# Patient Record
Sex: Male | Born: 1980 | Race: White | Hispanic: No | Marital: Single | Smoking: Current every day smoker
Health system: Southern US, Community
[De-identification: ages and names within clinical notes are randomized; demographics above are authoritative.]

## PROBLEM LIST (undated history)

## (undated) DIAGNOSIS — I4891 Unspecified atrial fibrillation: Secondary | ICD-10-CM

## (undated) DIAGNOSIS — I10 Essential (primary) hypertension: Secondary | ICD-10-CM

## (undated) DIAGNOSIS — E7800 Pure hypercholesterolemia, unspecified: Secondary | ICD-10-CM

## (undated) HISTORY — PX: DENTAL SURGERY: SHX609

---
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
## 2001-01-09 ENCOUNTER — Emergency Department (HOSPITAL_COMMUNITY): Admission: EM | Admit: 2001-01-09 | Discharge: 2001-01-09 | Payer: Self-pay | Admitting: Emergency Medicine

## 2001-02-17 ENCOUNTER — Inpatient Hospital Stay (HOSPITAL_COMMUNITY): Admission: EM | Admit: 2001-02-17 | Discharge: 2001-02-20 | Payer: Self-pay | Admitting: *Deleted

## 2001-02-20 ENCOUNTER — Encounter: Payer: Self-pay | Admitting: Emergency Medicine

## 2001-02-28 ENCOUNTER — Emergency Department (HOSPITAL_COMMUNITY): Admission: EM | Admit: 2001-02-28 | Discharge: 2001-02-28 | Payer: Self-pay | Admitting: Emergency Medicine

## 2001-03-06 ENCOUNTER — Inpatient Hospital Stay (HOSPITAL_COMMUNITY): Admission: EM | Admit: 2001-03-06 | Discharge: 2001-03-10 | Payer: Self-pay | Admitting: *Deleted

## 2001-05-03 ENCOUNTER — Emergency Department (HOSPITAL_COMMUNITY): Admission: EM | Admit: 2001-05-03 | Discharge: 2001-05-03 | Payer: Self-pay | Admitting: Emergency Medicine

## 2001-05-06 ENCOUNTER — Emergency Department (HOSPITAL_COMMUNITY): Admission: EM | Admit: 2001-05-06 | Discharge: 2001-05-06 | Payer: Self-pay | Admitting: *Deleted

## 2001-08-20 HISTORY — PX: APPENDECTOMY: SHX54

## 2007-01-14 ENCOUNTER — Ambulatory Visit: Payer: Self-pay | Admitting: Oncology

## 2007-01-16 LAB — IVY BLEEDING TIME: Bleeding Time: 3 Minutes (ref 2.0–8.0)

## 2007-01-21 ENCOUNTER — Ambulatory Visit (HOSPITAL_COMMUNITY): Admission: RE | Admit: 2007-01-21 | Discharge: 2007-01-21 | Payer: Self-pay | Admitting: Orthopedic Surgery

## 2007-01-21 ENCOUNTER — Encounter (INDEPENDENT_AMBULATORY_CARE_PROVIDER_SITE_OTHER): Payer: Self-pay | Admitting: Orthopedic Surgery

## 2007-01-21 LAB — VON WILLEBRAND PANEL
Factor-VIII Activity: 81 % (ref 75–150)
Ristocetin-Cofactor: 74 % (ref 50–150)
Von Willebrand Ag: 85 % normal (ref 61–164)

## 2007-01-21 LAB — FACTOR 9 ASSAY: Coagulation Factor IX: 152 % — ABNORMAL HIGH (ref 53–150)

## 2007-01-21 LAB — FACTOR 13, QUAL

## 2011-01-02 NOTE — Op Note (Signed)
[name], Weiss              ACCOUNT NO.:  [id number]   MEDICAL RECORD NO.:  [id number]          PATIENT TYPE:  AMB   LOCATION:  SDS                          FACILITY:  [facility]   PHYSICIAN:  Dionne Ano. Gramig III, M.D.DATE OF BIRTH:  12-May-1981   DATE OF PROCEDURE:  01/21/2007  DATE OF DISCHARGE:  01/21/2007                               OPERATIVE REPORT   PREOPERATIVE DIAGNOSIS:  Right hand fiberglass injury with resultant  tenosynovitis of the flexor digitorum profundus and flexor digitorum  superficialis tendons, imbedded fiberglass, pain with activity.   POSTOPERATIVE DIAGNOSIS:  Right hand fiberglass injury with resultant  tenosynovitis of the flexor digitorum profundus and flexor digitorum  superficialis tendons, imbedded fiberglass, pain with activity.   PROCEDURE:  1. Incision and drainage of right hand skin, subcutaneous tissue,      muscle and tendon.  2. Extensive tenosynovectomy flexor digitorum profundus and flexor      digitorum superficialis tendon, right hand.  3. Foreign body removal, right hand.  4. Neurolysis common digital nerve to the second web space.   SURGEON:  Dionne Ano. Amanda Pea, M.D.   ASSISTANT:  None.   COMPLICATIONS:  None.   SPECIMENS:  One.   ANESTHESIA:  General.   TOURNIQUET TIME:  Less than an hour.   INDICATIONS FOR PROCEDURE:  Mr. Wesley Weiss is 45 old male presents above-  mentioned diagnosis.  I have counseled regard to risks, benefits surgery  including risk of infection, bleeding, anesthesia and damage to normal  structures.  He had a preoperative evaluation by Dr. Cyndie Chime  revealing no evidence of advanced coagulation disorder.  I have  discussed these issues with him extensively.   The patient felt that he had a hemophilia factor VIII abnormality and he  underwent workup which in fact, did not show true hemophilia.  I have  discussed this with him at length.  Given this history, I felt it  imperative he get a full workup to  make sure that he did not have a  noted bleeding disorder.   Despite counseling preoperatively, etc. 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  Nevertheless, after a thorough and thoughtful evaluation by  Dr. Cyndie Chime of hematology we have established that the patient does  not in fact have factor VIII deficiency (hemophilia).   PROCEDURE NOTE:  The patient was seen by myself and  anesthesia, he was  permitted, H&P performed and consent obtained.  He was then taken to  operative suite, underwent a smooth induction of general anesthesia was  given preoperative vancomycin and following this the arm was prepped and  draped usual sterile fashion, Betadine scrub and paint.   Once this done, I performed incision about the hand.  His incision was  centered over the fiberglass entrance wound.  This was a 3 cm incision  along Brunner's lines.  I opened the skin flap nicely.  Once this done,  I encountered one piece of fiberglass.  This was removed atraumatically.  Following this, there was a deeper piece imbedded and I  then performed a  inspection of the area.  The patient had the common digital nerve  evaluated and a neurolysis of the common digital nerve to the second web  space was performed.  The patient tolerated this well and no  complicating features.  This was swept out of harm's way and I  thoroughly explored.  It was somewhat hyperemic but stable.   Following, common digital nerve neurolysis, I then identified the flexor  digitorum profundus and superficialis tendons.  There was abundant  tenosynovitis and this was removed meticulously.  Both the flexor  digitorum profundus and flexor digitorum superficialis tendons underwent  as distinct and separate tenolysis and tenosynovectomy.  There were  small bits of fiberglass encased in this area and this was removed.   Following this, I swept the retractor more deeply  and identified that  fiberglass deeper in the hand and remove this as well.  This was deep to  the flexor tendon sheath and near the MCP joint of course.   Thus tenosynovectomy neurolysis of the common digital nerve and removal  of multiple fiberglass foreign bodies (three to four was accomplished  up.  There two main fragments notable and these were removed entirely.   Following this, I then irrigated copiously with copious amounts of  saline and performed stress radiography.  Stress radiography of the  pieces in a specimen cup showed notable findings and abnormal foreign  body composition under fluoro.  I then x-rayed hand and noted no foreign  bodies indicating that we had removed all the fiberglass.  With this  performed, I then irrigated additionally deflated tourniquet, obtained  hemostasis bipolar electrocautery and closed wound with interrupted  Prolene suture.  He was dressed sterilely.  10 mL of 0.25% Sensorcaine  without epinephrine were placed to provide postop analgesia.  The  patient tolerated this well and there no complicating features.  Following this he was taken to recovery room.  He will be discharged  home on Vicodin for pain, return to see me office in 10 days and work on  finger range of motion.  I have discussed with him do's and don't's,  etc. and all questions encouraged and answered.           ______________________________  Dionne Ano. Everlene Other, M.D.     Nash Mantis  D:  01/21/2007  T:  01/22/2007  Job:  [id number]

## 2011-01-05 NOTE — H&P (Signed)
[facility]  Patient:    Wesley Weiss, Wesley Weiss                   MRN: [id number] Adm. Date:  [id number] Disc. Date: [id number] Attending:  Denny Weiss Dictator:   Wesley Weiss, R.N., F.N.P.                   Psychiatric Admission Assessment  DATE OF ADMISSION:  March 06, 2001  CHIEF COMPLAINT:  "Im angry and depressed.  Ive felt like killing myself all of my life and now its worse."  PATIENT IDENTIFICATION:  This is an 30 year old Caucasian male who is a voluntary admission for depression with thoughts of shooting himself and access to guns.  HISTORY OF PRESENT ILLNESS:  The patient reports a past history of depression, conduct disorder, PTSD, and attention-deficit/hyperactivity disorder.  He came in voluntarily to the [facility] office accompanied by his grandfather secondary to suicidal thoughts with a plan to shoot himself and access to a gun, which he reports he had in his hand and then it was taken away by his friend.  He continues to feel thoughts of suicide.  He denies any homicidal ideation.  He denies any auditory or visual hallucinations.  He states that he has been hopeless, sad, and tearful.  He reports being hostile, irritable, and emotionally withdrawn.  He has generally been uncooperative today throughout the interview.  The patient reports that he has been noncompliant with his medications but will give no particular reason for this.  He has not taken his Zyprexa or his other medications, he states, in over a week.  He cites social stressors of finding out that his girlfriend is pregnant but he is quite unclear about this.  This was revealed to the assessment team assessing him for admission and he states today that he is concerned about the pressure with his girlfriend being pregnant but he cites a mixed social history which is inconsistent with his previous history reported.  PAST PSYCHIATRIC HISTORY:  The patient is followed by Dr.  Elna Weiss at [facility], last seen two days ago.  He has one previous inpatient admission at [facility] and that one on February 17, 2001. He was also hospitalized previously at [facility] at age 35 for conduct disorder, depression, PTSD, and ADHD.  He has a history of previous suicide attempts including prior attempts to hang himself.  SUBSTANCE ABUSE HISTORY:  The patient smokes approximately one pack per day of cigarettes every day.  He reports smoking marijuana two to three times per week and reports drinking MD 20/20 approximately one pint per day and about 64 ounces of [employer] every day.  PAST MEDICAL HISTORY:  The patient is followed by Dr. Earlene Weiss here in [place].  Medical problems include asthma, kyphosis, and a history of gastric ulcers.  REVIEW OF SYSTEMS:  Back pain 7 out of 10 secondary to his kyphosis and the patient reports vomiting bright red blood for about the last five days but denies any complaints of abdominal pain.  He denies any fever, chills, or signs of infection and he denies any history of syncope or seizures.  MEDICATIONS:  The patient reports that he has been noncompliant for at least one week with his medications and got rid of all of them.  He has been prior to that taking Zyprexa 15 mg q.h.s., Wellbutrin 50 mg at h.s., and Ativan 2.5 mg p.o. b.i.d.  The patient takes Motrin for his back problems periodically.  DRUG ALLERGIES:  PENICILLIN.  POSITIVE PHYSICAL FINDINGS:  The PE was done July 2 at [facility] and was essentially negative.  VITAL SIGNS:  On admission here, the patients vital signs are temperature 98.1, pulse 72, respirations 16, blood pressure 123/78 sitting and 116/82 standing.  SOCIAL HISTORY:  The patient currently reports living alone since he was discharged from this hospital approximately two weeks ago although he came in yesterday accompanied by his grandfather.  He is  educated through the 11th grade.  He is not currently working.  He states he is engaged to get married in September 2002.  He states he currently has three children, a 17-year-old named Wesley Weiss, a 22-month-old named Wesley Weiss, and one unborn child, all by his girlfriend, Wesley Weiss.  However, the patients previous history states that he has no children at all.  The patient reports that he currently lives at [facility] in [place].  We will attempt to contact his grandfather to corroborate his social history.  Currently, attempts to contact him have been unsuccessful.  There is an answer at the home but the grandfather is not available.  FAMILY HISTORY:  Positive for a father with a history of substance abuse.  MENTAL STATUS EXAMINATION:  This is a casually dressed, sleepy patient who is sluggish mentally.  He is irritable and hostile and is passively cooperative. He offers only minimal responses to questions.  Speech is relevant.  Mood is angry, sad, hopeless, and depressed.  Thought process is positive for suicidal ideation and the patient will contract for safety.  He denies any homicidal ideation or auditory or visual hallucinations.  Cognitively he is intact.  ADMISSION DIAGNOSES: Axis I:    1. Major depression, recurrent, severe.            2. Attention-deficit/hyperactivity disorder by history.            3. Conduct disorder by history. Axis II:   Borderline personality disorder. Axis III:  1. Rule out gastrointestinal bleeding.            2. Gastric ulcer by history.            3. Asthma by history.            4. Kyphosis. Axis IV:   Moderate problems with the primary support group, specifically            issues with his girlfriend and her current pregnancy, which we will            investigate further. Axis V:    Current 25, past year 74.  INITIAL PLAN OF CARE:  Plan is to admit the patient to stabilize his mood. Q.51m. checks are in place.  We will discontinue his  Librium detoxification protocol since he appears to be oversedated and we will offer him Librium 25 mg on a p.r.n. basis only.  We will resume his Zyprexa 15 mg at h.s. and  increase his Wellbutrin to 100 mg SR p.o. q.d.  We will need to rule out a GI bleed on him and so therefore we will check his CBC.  We will guaiac his stools x 2 and check his vital signs for postural changes manually on a b.i.d. basis.  We will ask the case manager to corroborate his history with his grandparents and investigate his social stressors a bit more.  ESTIMATED LENGTH OF STAY:  Three to five days. DD:  03/10/01 TD:  03/12/01 Job: [id number]

## 2011-01-05 NOTE — Discharge Summary (Signed)
[facility]  Patient:    Wesley Weiss Visit Number: [id number] MRN: [id number]          Service Type: PSY Location: 50 [phone] Attending Physician:  Denny Peon Adm. Date:  03/06/2001 Disc. Date: 03/10/2001                             Discharge Summary  INTRODUCTION:  Wesley Weiss is an 30 year old single white male who was admitted because of depression and suicidal ideation.  He has a long history of depression with increased suicidal thoughts with a plan to kill himself with a gun.  The patient told that his depression had increased after his father overdosed and died a few days after fathers day.  The patients sleeping has been decreased.  He has difficulty falling asleep.  He has loss of about 15 pounds over the past two months, also withdrawn.  He also had thoughts about hanging himself.  In the past, he had several suicide attempts trying to hang himself, cut himself, hitting his head.  The patient comes from a very troubled environment, mostly he was staying with grandparents. Previously, he was in foster homes.  Prior to admission, the patient was on Wellbutrin 100 mg daily, Zyprexa 15 mg at bedtime and Prilosec.  MEDICAL PROBLEMS:  These include asthma, kyphosis and gastric ulcers.  PHYSICAL EXAMINATION UPON ADMISSION:  This was normal.  HOSPITAL COURSE:  After admitting to the ward, the patient was placed on special observation.  He was able to contract for safety while on the unit. His medication was increased, Zyprexa was increased to 20 mg at bedtime, Wellbutrin sustained release 100 mg to twice a day, and Ativan was added on a p.r.n. basis for anxiety.  On February 18, 2001, the patient was still depressed and tearful but able to promise safety.  He was noncompliant with medication because he felt too drugged on current dose of medication.  He denied hallucinations.  Subsequently, the dose of Zyprexa was decreased to 10 mg  at bedtime.  On February 19, 2001, still depressed and down but affect was a bit bright.  He still admitted to having vision and occasional voices, but slept better and does not feel as much drugged up as before.  He denied suicidal or homicidal thoughts.  Apparently Wellbutrin was not the medication the patient tolerated the best and since treatment with Wellbutrin was not effective in the past and the patient could not tolerate a higher dose, I started him on Effexor initially 37.5 mg daily, later Effexor XR 37.5 mg twice a day.  On February 19, 2001, the patient complained of increased back pain.  He has kyphosis and wears a back brace.  He was sent to the emergency room but no special treatment was provided over there.  On February 20, 2001, the patient was feeling much better.  No more hallucinations for 24 hours, no dangerous ideations.  He tolerated the medication well and was able to promise safety.  Emergency room reported no findings related to the patients back.  There was contact with the patients grandmother who felt that the patient would be ready for discharge and could stay with her.  MEDICAL PROBLEMS:  Vital signs were stable during the hospitalization with blood pressure 130/80, normal temperature, respiratory rate and pulse.  The patient is 6 feet tall and weighed 196 pounds.  REVIEW OF ADDITIONAL LAB WORK:  Normal CBC  with exception of slight elevation of white blood cell count at 11.6, otherwise normal.  Normal chemistry-17. Urine drug screen was negative for substance of abuse.  DISCHARGE DIAGNOSES: Axis I.   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 Axis II.  Borderline personality disorder. Axis III. 1. Asthma.           2. Gastric ulcer.           3. Kyphosis. Axis IV.  Moderate stress related to social environment and medical problems. Axis V.   Global Assessment of Functioning upon admission 35, maximum of 65,           upon  discharge 55 to 60.  DISCHARGE RECOMMENDATION: 1. Discharge medications:    a. Prilosec 40 mg at bedtime.    b. Zyprexa 10 mg at bedtime.    c. The patient also may take half tablet of Zyprexa, 5 mg, during the       day if needed for hallucinations.    d. Effexor XR was increased to 37.5 mg two tablets in the morning and one       tablet in the afternoon.    e. Ibuprofen 600 mg for pain.    f. Arthrotec 50 mg twice a day as needed for pain. 2. The patient should call for recurrence of symptoms or problems with    medication.  He should come to emergency room if suicidal thoughts recur. 3. He should follow up with his family physician for chronic back pain. 4. The patient will receive appointment from _____________ if he calls    tomorrow morning [facility].  The patient understood the instructions and, in good condition, was discharged home to the care of his grandmother. Attending Physician:  Denny Peon DD:  04/08/01 TD:  04/09/01 Job: [id number]

## 2011-01-05 NOTE — Discharge Summary (Signed)
[facility]  Patient:    Wesley Weiss, Wesley Weiss Visit Number: [id number] MRN: [id number]          Service Type: PSY Location: 50 0504 02 Attending Physician:  Denny Peon Adm. Date:  03/06/2001 Disc. Date: 03/10/2001                             Discharge Summary  INTRODUCTION:  Wesley Weiss is an 30 year old white male who was admitted because of depression and suicidal ideation with plan to kill himself with a knife or shoot himself with a gun.  He had planned to get access to the gun. Patient only 2 days prior to admission was seen by Dr. Katrinka Blazing at [facility] and recently at the beginning of July was hospitalized at our [facility] unit.  It seemed the source of this decompensation was living alone and not enough support in the community. Patient has a long history of mental problems, being hospitalized in the past at [facility] at the age of 62 and being previously hospitalized 1 time at [facility].  MEDICAL PROBLEMS:  Medically, he suffers from asthma, kyphosis, and a history of gastric ulcers.  His most recent medications were Zyprexa 15 mg at bedtime, Wellbutrin and Ativan.  He takes Motrin for a back problem.  PHYSICAL EXAMINATION:  Done during his most recent hospitalization was essentially normal.  Vital signs upon admission were blood pressure 123/78, pulse 72, respirations 16, and temperature 98.  INITIAL DIAGNOSTIC IMPRESSION: Axis I:    1. Major depression, recurrent, with psychotic features.            2. Attention deficit disorder by history.            3. Conduct disorder by history. Axis II:   Borderline personality disorder.  HOSPITAL COURSE:  After being admitted to the ward, patient was placed on special observation.  His medication, including Wellbutrin 50 mg daily, Zyprexa 15 mg, albuterol, and Motrin were reintroduced.  Wellbutrin  was increased the next day to 100 mg sustained release form once a day.  On July 20 because of still being paranoid, feeling people were talking about him, trying to set him up, etc. I decided to increase Zyprexa.  Since patient showed some significant problems with depression, I decided to increase his Wellbutrin SR as well to dose of 100 mg twice a day.  Because patient did not sleep, Seroquel was introduced in dose of 25 mg which made patient drowsy in the morning.  Therefore, on July 21, Seroquel was decreased to 12.5 mg with good result.  On July 22, free from psychosis, dangerous ideations, slept well, improved mood and affect, felt safe and ready for discharge.  The day before, the social worker made contact with patients adopted grandparents who agreed to take him back to stay with them.  Providing that he will take his medication and stay in a protective environment prognosis is good.  MEDICAL PROBLEMS:  Patient complained of some stomach discomfort and upper back pain, which is chronic in his situation.  These symptoms did not require any special medical intervention.  LABORATORY DATA:  CBC was normal, chemistry 17 normal, drug screen positive for benzodiazepines but patient took Xanax.  Urinalysis was normal.  DISCHARGE DIAGNOSES: Axis I:    1. Major depression, recurrent, moderate to severe.  2. Attention deficit disorder by history. Axis II:   Borderline personality disorder. Axis III:  History of peptic ulcer disease, asthma and kyphosis. Axis IV:   Moderate stressors related to social environment, housing and            financial problems. Axis V:    Global assessment of function upon admission 25, maximum for past            year 65, upon discharge 55-60.  DISCHARGE MEDICATIONS: 1. Zyprexa 20 mg at bedtime. 2. Wellbutrin SR 100 mg twice a day. 3. Protonix 40 mg daily. 4. Albuterol as needed for problems with breathing. 5. Seroquel 12.5 mg or 25 mg as  needed for insomnia.  DISCHARGE RECOMMENDATIONS:  Patient should stay sober, no alcohol or drugs. He should if side effects of medication occur or if there is recurrent suicidal thoughts or behaviors.  Patient understood the instructions.  Side effects of medications were explained.  At the time of discharge he did not experience any.  Discharged in good condition in care of his family. Attending Physician:  Denny Peon DD:  04/09/01 TD:  04/10/01 Job: [id number]
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
## 2011-06-07 LAB — BASIC METABOLIC PANEL
Chloride: 106
Creatinine, Ser: 0.95
GFR calc Af Amer: >60
Sodium: 138

## 2011-06-07 LAB — URINALYSIS, ROUTINE W REFLEX MICROSCOPIC
Bilirubin Urine: NEGATIVE
Nitrite: NEGATIVE
Specific Gravity, Urine: 1.021
Urobilinogen, UA: 0.2

## 2011-06-07 LAB — CBC
HCT: 38.4 — ABNORMAL LOW
Platelets: 334
RDW: 13.6

## 2011-08-21 HISTORY — PX: TONSILLECTOMY: SUR1361

## 2018-06-11 ENCOUNTER — Encounter (HOSPITAL_BASED_OUTPATIENT_CLINIC_OR_DEPARTMENT_OTHER): Payer: Self-pay

## 2018-06-11 ENCOUNTER — Emergency Department (HOSPITAL_BASED_OUTPATIENT_CLINIC_OR_DEPARTMENT_OTHER)
Admission: EM | Admit: 2018-06-11 | Discharge: 2018-06-12 | Disposition: A | Discharge status: Home / Self Care | Payer: Self-pay | Attending: Emergency Medicine | Admitting: Emergency Medicine

## 2018-06-11 ENCOUNTER — Other Ambulatory Visit: Payer: Self-pay

## 2018-06-11 DIAGNOSIS — I10 Essential (primary) hypertension: Secondary | ICD-10-CM | POA: Insufficient documentation

## 2018-06-11 DIAGNOSIS — I4891 Unspecified atrial fibrillation: Secondary | ICD-10-CM | POA: Insufficient documentation

## 2018-06-11 DIAGNOSIS — F17210 Nicotine dependence, cigarettes, uncomplicated: Secondary | ICD-10-CM | POA: Insufficient documentation

## 2018-06-11 DIAGNOSIS — Z7982 Long term (current) use of aspirin: Secondary | ICD-10-CM | POA: Insufficient documentation

## 2018-06-11 DIAGNOSIS — I493 Ventricular premature depolarization: Secondary | ICD-10-CM | POA: Insufficient documentation

## 2018-06-11 HISTORY — DX: Essential (primary) hypertension: I10

## 2018-06-11 HISTORY — DX: Unspecified atrial fibrillation: I48.91

## 2018-06-11 HISTORY — DX: Pure hypercholesterolemia, unspecified: E78.00

## 2018-06-11 NOTE — ED Triage Notes (Signed)
Sharp, left side chest pain that radiates to left arm for the last few days, hx of a-fib, has not had his medication in over two months bc he cannot afford it

## 2018-06-12 ENCOUNTER — Emergency Department (HOSPITAL_BASED_OUTPATIENT_CLINIC_OR_DEPARTMENT_OTHER): Payer: Self-pay

## 2018-06-12 LAB — CBC
HEMATOCRIT: 40.8 % (ref 39.0–52.0)
HEMOGLOBIN: 14 g/dL (ref 13.0–17.0)
MCH: 32.4 pg (ref 26.0–34.0)
MCHC: 34.3 g/dL (ref 30.0–36.0)
MCV: 94.4 fL (ref 80.0–100.0)
Platelets: 357 10*3/uL (ref 150–400)
RBC: 4.32 MIL/uL (ref 4.22–5.81)
RDW: 13.5 % (ref 11.5–15.5)
WBC: 16.1 10*3/uL — ABNORMAL HIGH (ref 4.0–10.5)
nRBC: 0 % (ref 0.0–0.2)

## 2018-06-12 LAB — BASIC METABOLIC PANEL
Anion gap: 7 (ref 5–15)
BUN: 12 mg/dL (ref 6–20)
CALCIUM: 9.2 mg/dL (ref 8.9–10.3)
CO2: 25 mmol/L (ref 22–32)
Chloride: 104 mmol/L (ref 98–111)
Creatinine, Ser: 1.08 mg/dL (ref 0.61–1.24)
GFR calc Af Amer: >60 mL/min (ref >60)
GLUCOSE: 126 mg/dL — AB (ref 70–99)
Potassium: 3.5 mmol/L (ref 3.5–5.1)
Sodium: 136 mmol/L (ref 135–145)

## 2018-06-12 LAB — TROPONIN I: Troponin I: <0.03 ng/mL (ref <0.03)

## 2018-06-12 MED ORDER — DILTIAZEM HCL ER COATED BEADS 120 MG PO CP24
ORAL_CAPSULE | ORAL | Status: AC
  Administered 2018-06-12: 120 mg
  Filled 2018-06-12: qty 2

## 2018-06-12 MED ORDER — DILTIAZEM HCL ER COATED BEADS 180 MG PO CP24: 180.0000 mg | ORAL_CAPSULE | Freq: Every day | ORAL | 0 refills | Status: DC

## 2018-06-12 MED ORDER — DILTIAZEM HCL ER 90 MG PO CP12
180.0000 mg | ORAL_CAPSULE | Freq: Once | ORAL | Status: DC
  Filled 2018-06-12: qty 2

## 2018-06-12 NOTE — ED Provider Notes (Signed)
[facility] Provider Note: Lowella Dell, MD, FACEP  CSN: [id number] MRN: [id number] ARRIVAL: 06/11/18 at 2345 ROOM: MH01/MH01   CHIEF COMPLAINT  Chest Pain   HISTORY OF PRESENT ILLNESS  06/12/18 12:33 AM Wesley Weiss is a 37 y.o. male a history of paroxysmal atrial fibrillation who has been noncompliant with his diltiazem ER 180 mg for the past 2 months.  He is here with a 2-day history of what he describes his chest pain.  He describes it as a sharp stabbing pain in his left lateral chest that lasts about 2 seconds.  It is not associated with shortness of breath, nausea or diaphoresis.  Nothing seems to bring them on or make them better.  He describes the sensation as "really bad".  He has had no sustained chest pain.   Past Medical History:  Diagnosis Date  . A-fib (HCC)   . High cholesterol   . Hypertension     Past Surgical History:  Procedure Laterality Date  . APPENDECTOMY  2003  . DENTAL SURGERY    . TONSILLECTOMY  2013    No family history on file.  Social History   Tobacco Use  . Smoking status: Current Every Day Smoker    Packs/day: 1.00    Types: Cigarettes  . Smokeless tobacco: Never Used  Substance Use Topics  . Alcohol use: Not Currently  . Drug use: Not Currently    Prior to Admission medications   Medication Sig Start Date End Date Taking? Authorizing Provider  aspirin 325 MG EC tablet Take 325 mg by mouth daily.   Yes [provider]    Allergies Penicillins   REVIEW OF SYSTEMS  Negative except as noted here or in the History of Present Illness.   PHYSICAL EXAMINATION  Initial Vital Signs Blood pressure (!) 143/91, pulse 81, temperature 98.1 F (36.7 C), temperature source Oral, resp. rate 13, height 6' (1.829 m), weight 117.9 kg, SpO2 97 %.  Examination General: Well-developed, well-nourished male in no acute distress; appearance consistent with age of record HENT: normocephalic; atraumatic Eyes: pupils  equal, round and reactive to light; extraocular muscles intact Neck: supple Heart: regular rate and rhythm; frequent isolated PVCs which correlate to the patient's symptoms Lungs: clear to auscultation bilaterally Abdomen: soft; nondistended; nontender; bowel sounds present Extremities: No deformity; full range of motion; pulses normal Neurologic: Awake, alert and oriented; motor function intact in all extremities and symmetric; no facial droop Skin: Warm and dry Psychiatric: Normal mood and affect   RESULTS  Summary of this visit's results, reviewed by myself:   EKG Interpretation  Date/Time:  Wednesday June 11 2018 23:52:04 EDT Ventricular Rate:  82 PR Interval:    QRS Duration: 87 QT Interval:  347 QTC Calculation: 406 R Axis:   19 Text Interpretation:  Sinus rhythm Ventricular premature complex Borderline T wave abnormalities No previous ECGs available Confirmed by Paula Libra ([id number]) on 06/12/2018 12:33:40 AM      Laboratory Studies: Results for orders placed or performed during the hospital encounter of 06/11/18 (from the past 24 hour(s))  Basic metabolic panel     Status: Abnormal   Collection Time: 06/11/18 11:57 PM  Result Value Ref Range   Sodium 136 135 - 145 mmol/L   Potassium 3.5 3.5 - 5.1 mmol/L   Chloride 104 98 - 111 mmol/L   CO2 25 22 - 32 mmol/L   Glucose, Bld 126 (H) 70 - 99 mg/dL   BUN 12 6 - 20  mg/dL   Creatinine, Ser 2.95 0.61 - 1.24 mg/dL   Calcium 9.2 8.9 - 62.1 mg/dL   GFR calc non Af Amer >60 >60 mL/min   GFR calc Af Amer >60 >60 mL/min   Anion gap 7 5 - 15  CBC     Status: Abnormal   Collection Time: 06/11/18 11:57 PM  Result Value Ref Range   WBC 16.1 (H) 4.0 - 10.5 K/uL   RBC 4.32 4.22 - 5.81 MIL/uL   Hemoglobin 14.0 13.0 - 17.0 g/dL   HCT 30.8 65.7 - 84.6 %   MCV 94.4 80.0 - 100.0 fL   MCH 32.4 26.0 - 34.0 pg   MCHC 34.3 30.0 - 36.0 g/dL   RDW 96.2 95.2 - 84.1 %   Platelets 357 150 - 400 K/uL   nRBC 0.0 0.0 - 0.2 %  Troponin I      Status: None   Collection Time: 06/11/18 11:57 PM  Result Value Ref Range   Troponin I <0.03 <0.03 ng/mL   Imaging Studies: Dg Chest 2 View  Result Date: 06/12/2018 CLINICAL DATA:  Chest pain radiating to left arm. EXAM: CHEST - 2 VIEW COMPARISON:  None. FINDINGS: Heart and mediastinal contours are within normal limits. No focal opacities or effusions. No acute bony abnormality. IMPRESSION: No active cardiopulmonary disease. Electronically Signed   By: Charlett Nose M.D.   On: 06/12/2018 00:32    ED COURSE and MDM  Nursing notes and initial vitals signs, including pulse oximetry, reviewed.  Vitals:   06/11/18 2352 06/11/18 2356 06/12/18 0000  BP:  (!) 143/91 130/88  Pulse:  81 79  Resp:  13 (!) 21  Temp:  98.1 F (36.7 C)   TempSrc:  Oral   SpO2:  97% 97%  Weight: 117.9 kg    Height: 6' (1.829 m)     1:21 AM The patient's symptoms correlate exactly with his PVCs.  There is been no sustained V. tach or other arrhythmias seen.  He is not in atrial fibrillation.  We will restart his diltiazem and have him follow-up with his cardiologist in [place].  PROCEDURES    ED DIAGNOSES     ICD-10-CM   1. PVCs (premature ventricular contractions) I49.3        Anwen Cannedy, MD 06/12/18 3244

## 2018-12-23 ENCOUNTER — Other Ambulatory Visit: Payer: Self-pay

## 2018-12-23 ENCOUNTER — Emergency Department (HOSPITAL_BASED_OUTPATIENT_CLINIC_OR_DEPARTMENT_OTHER): Payer: Self-pay

## 2018-12-23 ENCOUNTER — Encounter (HOSPITAL_BASED_OUTPATIENT_CLINIC_OR_DEPARTMENT_OTHER): Payer: Self-pay | Admitting: *Deleted

## 2018-12-23 ENCOUNTER — Emergency Department (HOSPITAL_BASED_OUTPATIENT_CLINIC_OR_DEPARTMENT_OTHER)
Admission: EM | Admit: 2018-12-23 | Discharge: 2018-12-23 | Disposition: A | Discharge status: Home / Self Care | Payer: Self-pay | Attending: Emergency Medicine | Admitting: Emergency Medicine

## 2018-12-23 DIAGNOSIS — R059 Cough, unspecified: Secondary | ICD-10-CM

## 2018-12-23 DIAGNOSIS — I10 Essential (primary) hypertension: Secondary | ICD-10-CM | POA: Insufficient documentation

## 2018-12-23 DIAGNOSIS — F17210 Nicotine dependence, cigarettes, uncomplicated: Secondary | ICD-10-CM | POA: Insufficient documentation

## 2018-12-23 DIAGNOSIS — R05 Cough: Secondary | ICD-10-CM

## 2018-12-23 DIAGNOSIS — R042 Hemoptysis: Secondary | ICD-10-CM | POA: Insufficient documentation

## 2018-12-23 DIAGNOSIS — Z79899 Other long term (current) drug therapy: Secondary | ICD-10-CM | POA: Insufficient documentation

## 2018-12-23 MED ORDER — DILTIAZEM HCL ER COATED BEADS 180 MG PO CP24: 180.0000 mg | ORAL_CAPSULE | Freq: Every day | ORAL | 0 refills | Status: AC

## 2018-12-23 MED ORDER — FLUTICASONE PROPIONATE 50 MCG/ACT NA SUSP: 2.0000 | Freq: Every day | NASAL | 0 refills | Status: AC

## 2018-12-23 MED ORDER — CETIRIZINE HCL 10 MG PO TABS: 10.0000 mg | ORAL_TABLET | Freq: Every day | ORAL | 0 refills | Status: AC

## 2018-12-23 NOTE — Discharge Instructions (Signed)
Your evaluation today is reassuring.  I suspect the blood in your sputum is related to your chronic bronchitis.  Please continue your chronic medications that were prescribed today, I would also like for you to start using Zyrtec and Flonase to help with nasal congestion and cough.  If you have additional episodes of bloody sputum or notice larger amounts of blood in your sputum, develop fevers, worsening cough, chest pain or shortness of breath please return to the emergency department, otherwise you will need to follow-up with primary care.

## 2018-12-23 NOTE — ED Triage Notes (Signed)
Sneezed and coughed at the same time this am. States he coughed up about a teaspoon of bright red blood. Cough since yesterday. No fever.

## 2018-12-23 NOTE — ED Notes (Signed)
ED Provider at bedside. 

## 2018-12-28 NOTE — ED Provider Notes (Signed)
[facility] EMERGENCY DEPARTMENT Provider Note   CSN: [id number] Arrival date & time: 12/23/18  1131    History   Chief Complaint Chief Complaint  Patient presents with  . Nasal Congestion    HPI Wesley Weiss is a 38 y.o. male.     Wesley Weiss is a 38 y.o. male with a history of A. fib, hypertension and hyperlipidemia, who presents to the emergency department for evaluation of cough.  Patient reports that he has had some nasal congestion over the past few days and cough for the past 2 days.  This morning he coughed and sneezed at the same time and coughed up about a teaspoon of sputum with some bright red blood in it.  Patient reports since then he has not had any additional episodes of hemoptysis, no larger volume hemoptysis.  Patient reports he does have a chronic cough associated with his smoking, he smokes about a pack per day, in the past has had some occasional blood streaked sputum, but he showed his girlfriend who was concerned and recommended he come in.  He has not had any fevers, denies any chest pain or shortness of breath.  He has had some nasal congestion as well.  He is not on any blood thinners.  Has not had any pleuritic chest pain, lower extremity swelling or pain.  He does report that he ran out of his Cardizem and has been unable to schedule a follow-up appointment to get this represcribed, but has otherwise been in his usual state of health.  He does report that his allergies have been worse than usual and he thinks this is why he has been having some runny nose and cough more than usual.     Past Medical History:  Diagnosis Date  . A-fib (HCC)   . High cholesterol   . Hypertension     There are no active problems to display for this patient.   Past Surgical History:  Procedure Laterality Date  . APPENDECTOMY  2003  . DENTAL SURGERY    . TONSILLECTOMY  2013        Home Medications    Prior to Admission medications   Medication Sig  Start Date End Date Taking? Authorizing Provider  aspirin 325 MG EC tablet Take 325 mg by mouth daily.   Yes [provider]  cetirizine (ZYRTEC ALLERGY) 10 MG tablet Take 1 tablet (10 mg total) by mouth daily. 12/23/18   Dartha Lodge, PA-C  diltiazem (CARDIZEM CD) 180 MG 24 hr capsule Take 1 capsule (180 mg total) by mouth daily. 12/23/18   Dartha Lodge, PA-C  fluticasone (FLONASE) 50 MCG/ACT nasal spray Place 2 sprays into both nostrils daily. 12/23/18   Dartha Lodge, PA-C    Family History No family history on file.  Social History Social History   Tobacco Use  . Smoking status: Current Every Day Smoker    Packs/day: 1.00    Types: Cigarettes  . Smokeless tobacco: Never Used  Substance Use Topics  . Alcohol use: Not Currently  . Drug use: Not Currently     Allergies   Penicillins   Review of Systems Review of Systems  Constitutional: Negative for chills and fever.  HENT: Positive for rhinorrhea.   Respiratory: Positive for cough. Negative for chest tightness, shortness of breath and wheezing.   Cardiovascular: Negative for chest pain and leg swelling.  Musculoskeletal: Negative for arthralgias and myalgias.  Skin: Negative for color change and  rash.  All other systems reviewed and are negative.    Physical Exam Updated Vital Signs BP 120/86 (BP Location: Right Arm)   Pulse 61   Temp 97.6 F (36.4 C) (Oral)   Resp 16   Ht 6' (1.829 m)   Wt 108.2 kg   SpO2 95%   BMI 32.35 kg/m   Physical Exam Vitals signs and nursing note reviewed.  Constitutional:      General: He is not in acute distress.    Appearance: Normal appearance. He is well-developed and normal weight. He is not ill-appearing or diaphoretic.  HENT:     Head: Normocephalic and atraumatic.     Nose:     Comments: Bilateral nares patent with moderate mucosal edema and clear rhinorrhea present.     Mouth/Throat:     Mouth: Mucous membranes are moist.     Pharynx: Oropharynx is clear.      Comments: Posterior oropharynx clear and mucous membranes moist, there is mild erythema but no edema or tonsillar exudates, uvula midline, normal phonation, no trismus, tolerating secretions without difficulty. Eyes:     General:        Right eye: No discharge.        Left eye: No discharge.  Neck:     Musculoskeletal: Neck supple.     Comments: No rigidity Cardiovascular:     Rate and Rhythm: Normal rate and regular rhythm.     Heart sounds: Normal heart sounds.  Pulmonary:     Effort: Pulmonary effort is normal. No respiratory distress.     Breath sounds: Normal breath sounds.     Comments: Respirations equal and unlabored, patient able to speak in full sentences, lungs clear to auscultation bilaterally Abdominal:     General: Bowel sounds are normal. There is no distension.     Palpations: Abdomen is soft. There is no mass.     Tenderness: There is no abdominal tenderness. There is no guarding.     Comments: Abdomen soft, nondistended, nontender to palpation in all quadrants without guarding or peritoneal signs  Musculoskeletal:        General: No deformity.     Right lower leg: No edema.     Left lower leg: No edema.  Lymphadenopathy:     Cervical: No cervical adenopathy.  Skin:    General: Skin is warm and dry.     Capillary Refill: Capillary refill takes less than 2 seconds.  Neurological:     Mental Status: He is alert and oriented to person, place, and time.  Psychiatric:        Mood and Affect: Mood normal.        Behavior: Behavior normal.      ED Treatments / Results  Labs (all labs ordered are listed, but only abnormal results are displayed) Labs Reviewed - No data to display  EKG None  Radiology Dg Chest 2 View  Result Date: 12/23/2018 CLINICAL DATA:  Cough, hemoptysis. EXAM: CHEST - 2 VIEW COMPARISON:  Radiographs of June 12, 2018. FINDINGS: The heart size and mediastinal contours are within normal limits. Both lungs are clear. No pneumothorax or  pleural effusion is noted. The visualized skeletal structures are unremarkable. IMPRESSION: No active cardiopulmonary disease. Electronically Signed   By: Lupita Raider M.D.   On: 12/23/2018 12:25    Procedures Procedures (including critical care time)  Medications Ordered in ED Medications - No data to display   Initial Impression / Assessment and Plan / ED  Course  I have reviewed the triage vital signs and the nursing notes.  Pertinent labs & imaging results that were available during my care of the patient were reviewed by me and considered in my medical decision making (see chart for details).  Patient presents for evaluation after he had one episode of blood-streaked sputum associated with cough and rhinorrhea.  He has had no associated fevers, no chest pain or shortness of breath.  Does have a chronic smoking history and history of chronic bronchitis and has had a few episodes of blood-streaked sputum in the past.  Throughout the day he is continued to have a occasional dry cough with no further episodes of hemoptysis.  I suspect this is likely related to bronchitis.  He has clear lungs, no chest pain, no lower extremity edema.  His chest x-ray is clear.  I have low suspicion for PE or pulmonary hemorrhage.  Patient has been restarted on his home Cardizem per his request.  He has not had any associated fevers and I do not suspect COVID-19 infection.  Will treat with Flonase and Zyrtec for allergies in addition to patient's usual home medications.  Return precautions discussed.  Patient expresses understanding and agreement with plan.  Case discussed with Dr. Gustavus MessingZukowski who is in agreement with plan.  Final Clinical Impressions(s) / ED Diagnoses   Final diagnoses:  Cough  Blood in sputum    ED Discharge Orders         Ordered    diltiazem (CARDIZEM CD) 180 MG 24 hr capsule  Daily     12/23/18 1307    cetirizine (ZYRTEC ALLERGY) 10 MG tablet  Daily     12/23/18 1307    fluticasone  (FLONASE) 50 MCG/ACT nasal spray  Daily     12/23/18 1307           Dartha LodgeFord, Saoirse Legere N, [place] JerseyPA-C 12/28/18 1639    Vanetta MuldersZackowski, Scott, MD 01/01/19 1954

## 2019-05-12 IMAGING — CR DG CHEST 2V
2 series · 2 of 2 positions shown · non-contrast
Comparison: None.

CLINICAL DATA: Chest pain radiating to left arm.

EXAM:
CHEST - 2 VIEW

[w chest pa]
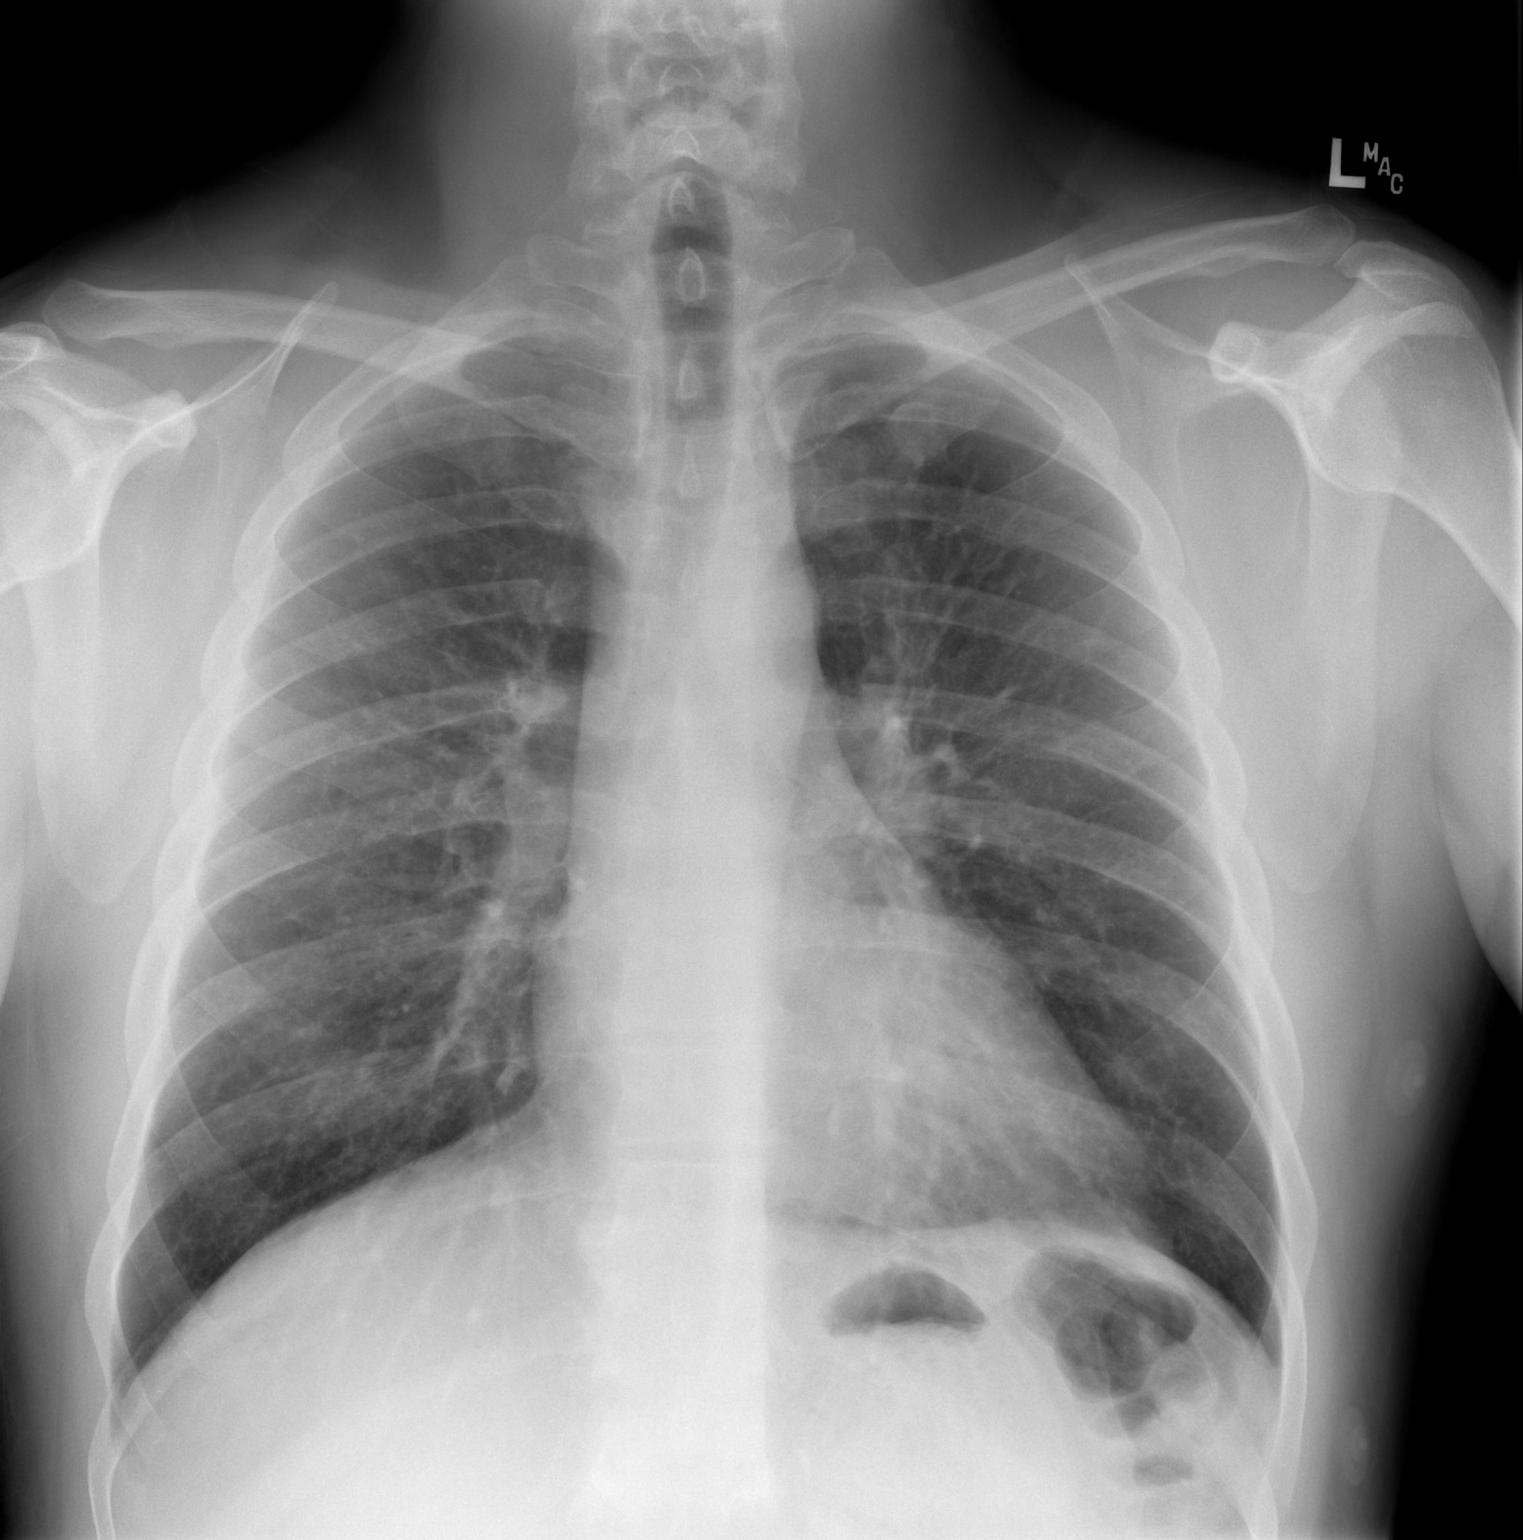

[w chest lat]
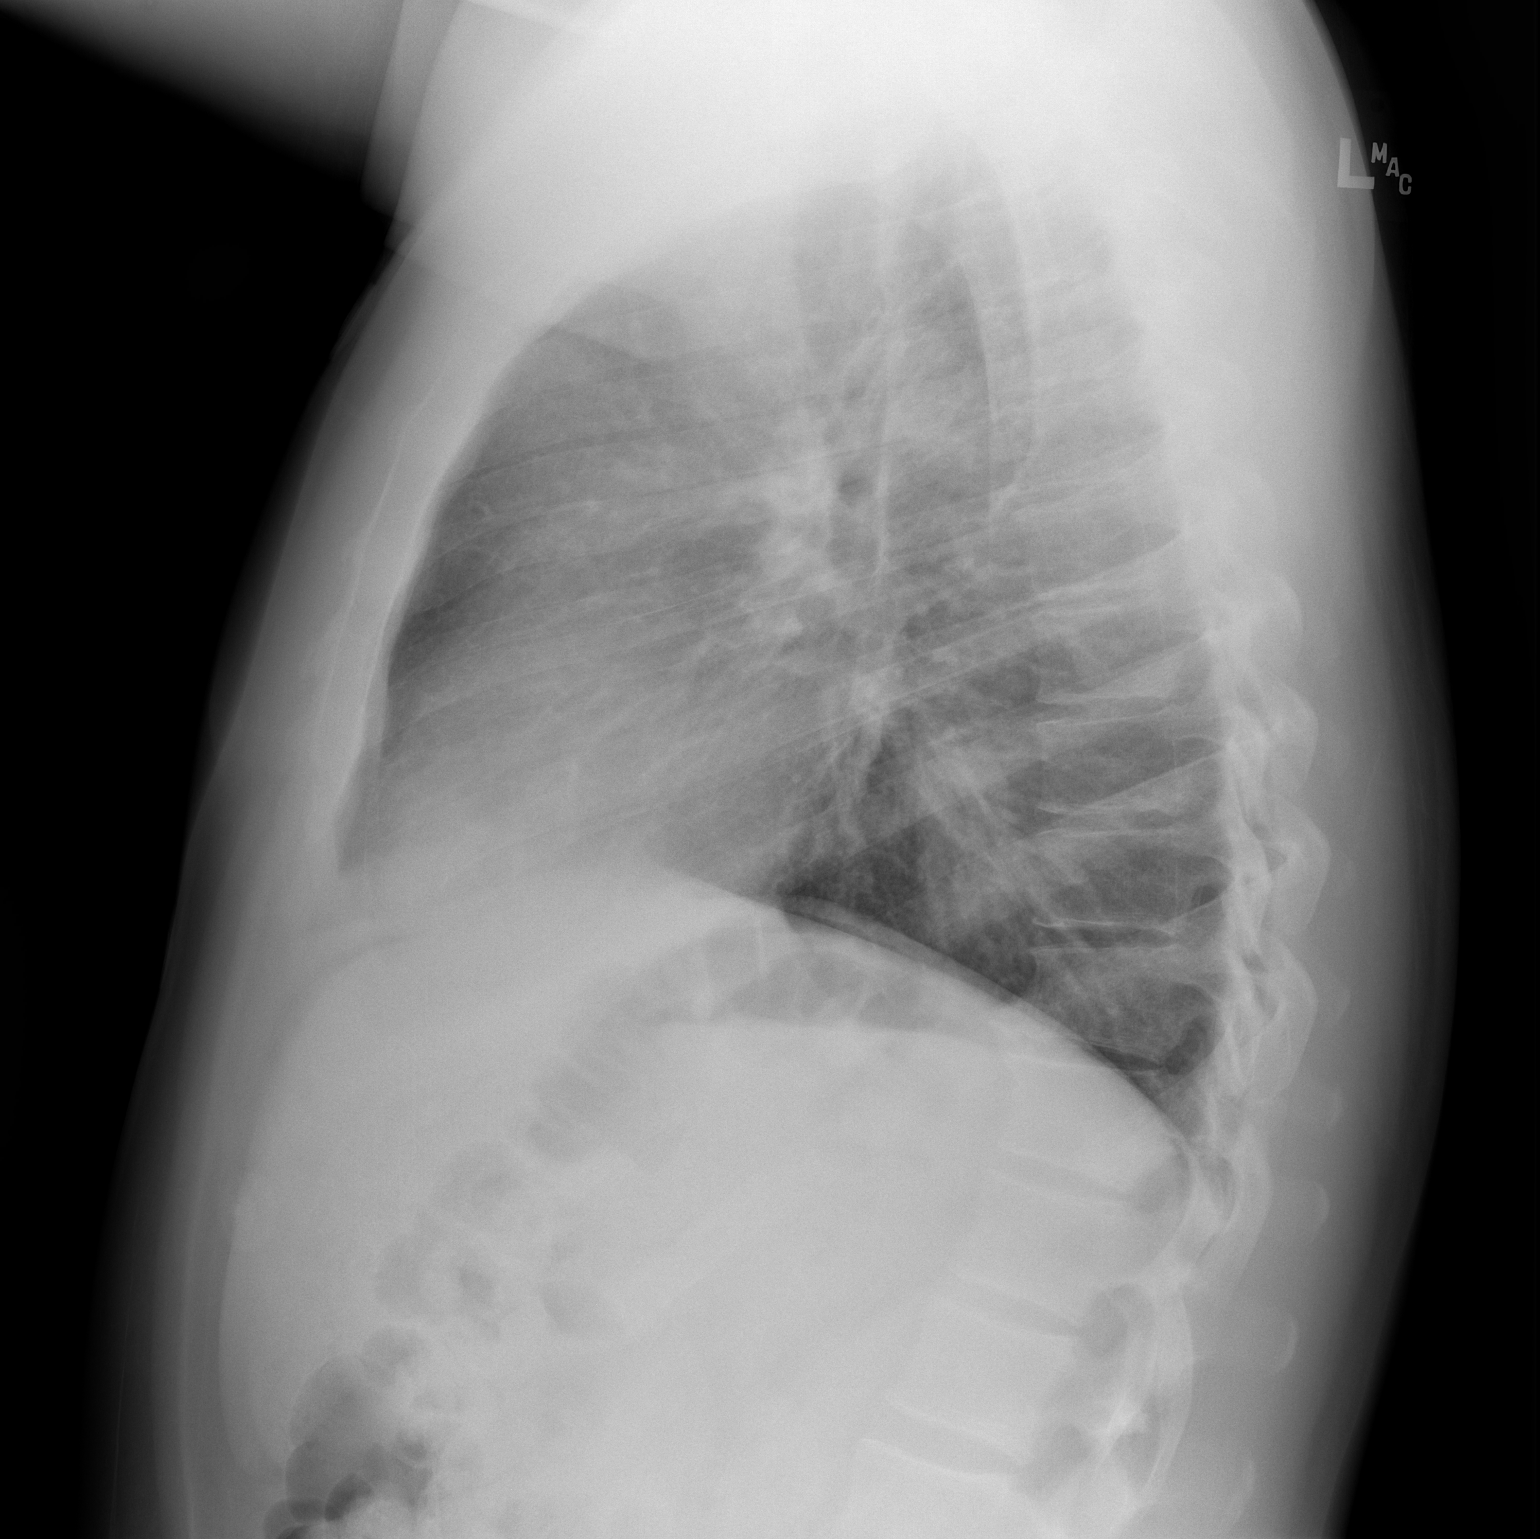

[2 of 2 positions shown; findings below may reference images not displayed]

FINDINGS: Heart and mediastinal contours are within normal limits. No focal
opacities or effusions. No acute bony abnormality.
IMPRESSION: No active cardiopulmonary disease.

## 2019-11-22 IMAGING — CR CHEST - 2 VIEW
2 series · 2 of 2 positions shown · non-contrast
Comparison: Radiographs June 12, 2018.

CLINICAL DATA: Cough, hemoptysis.

EXAM:
CHEST - 2 VIEW

[w chest pa]
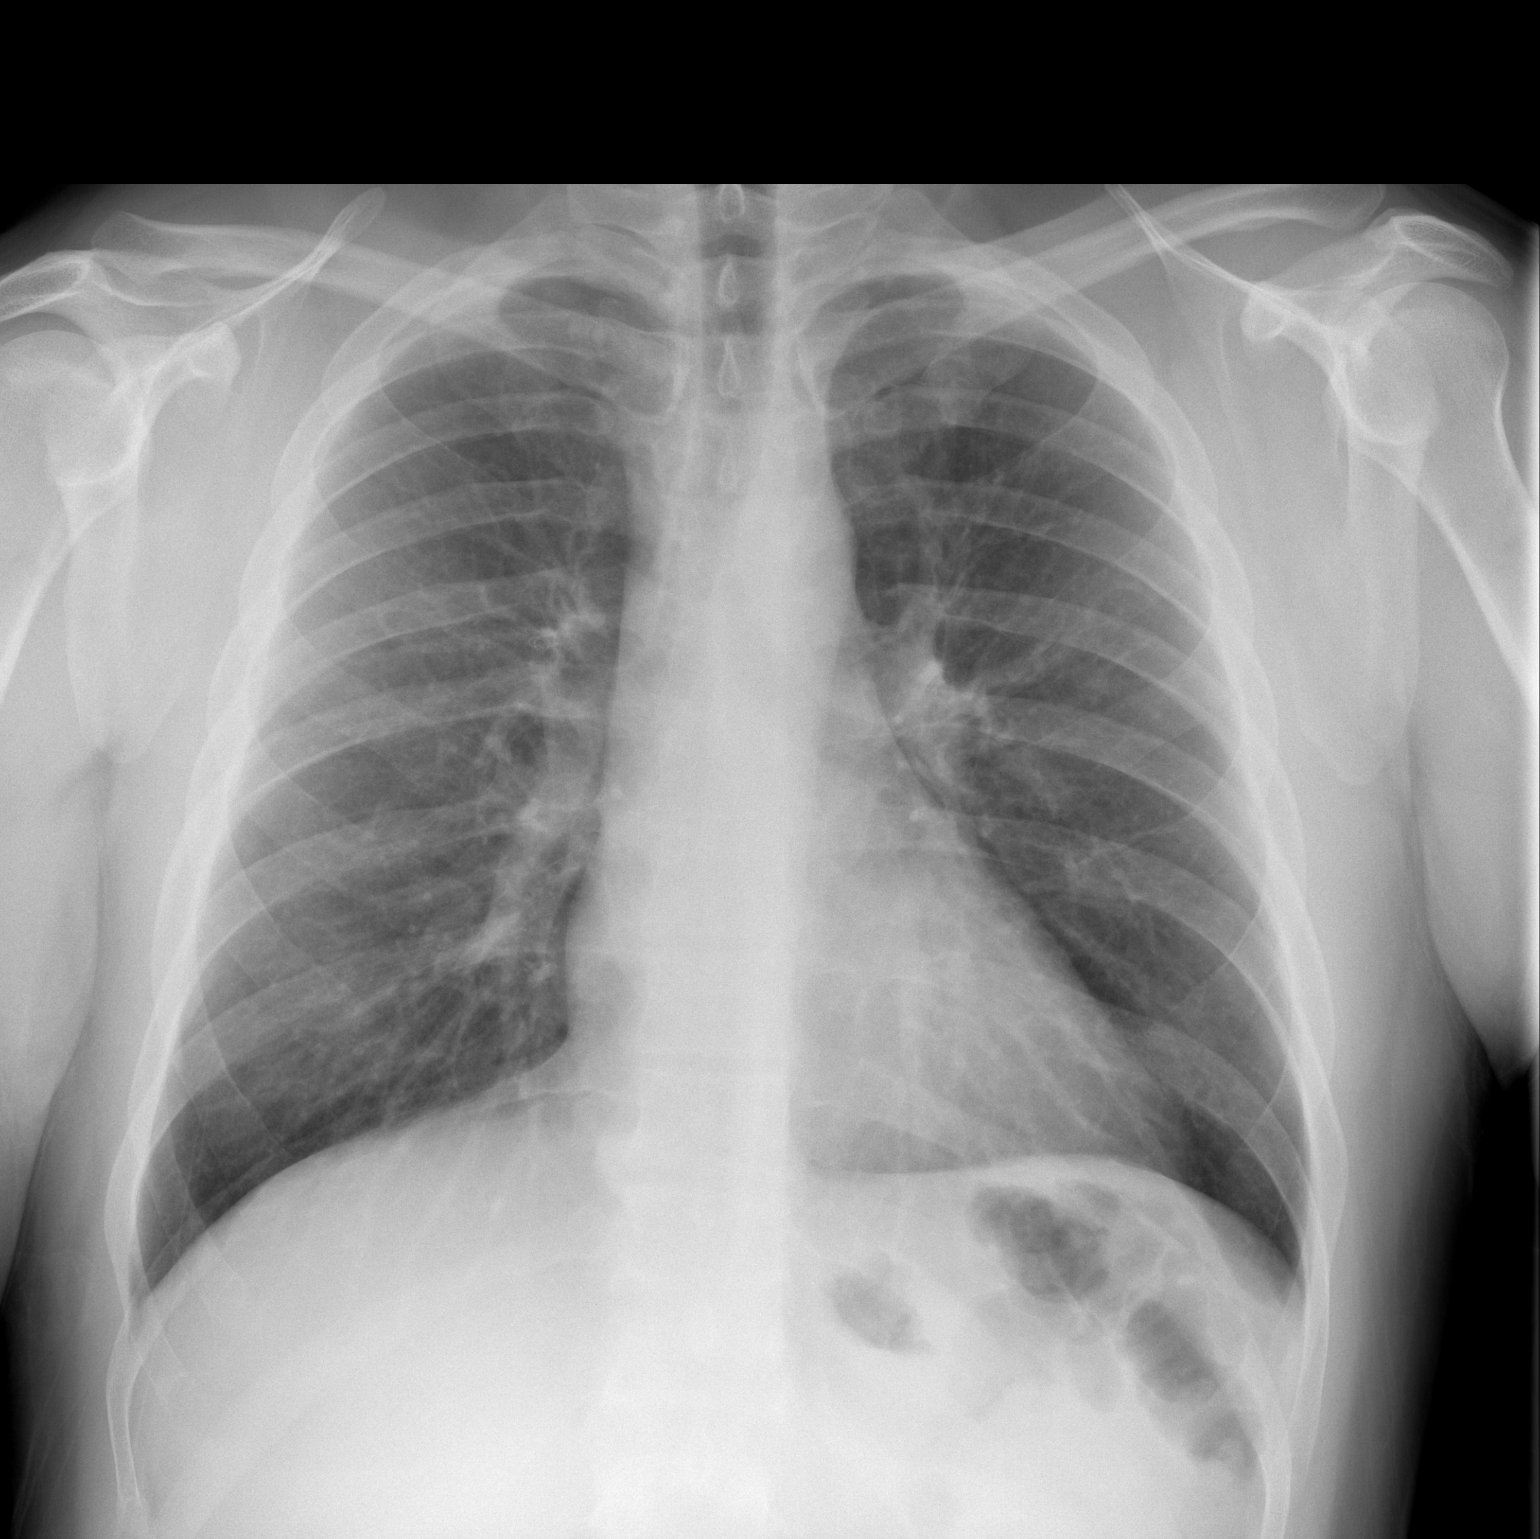

[w chest lat]
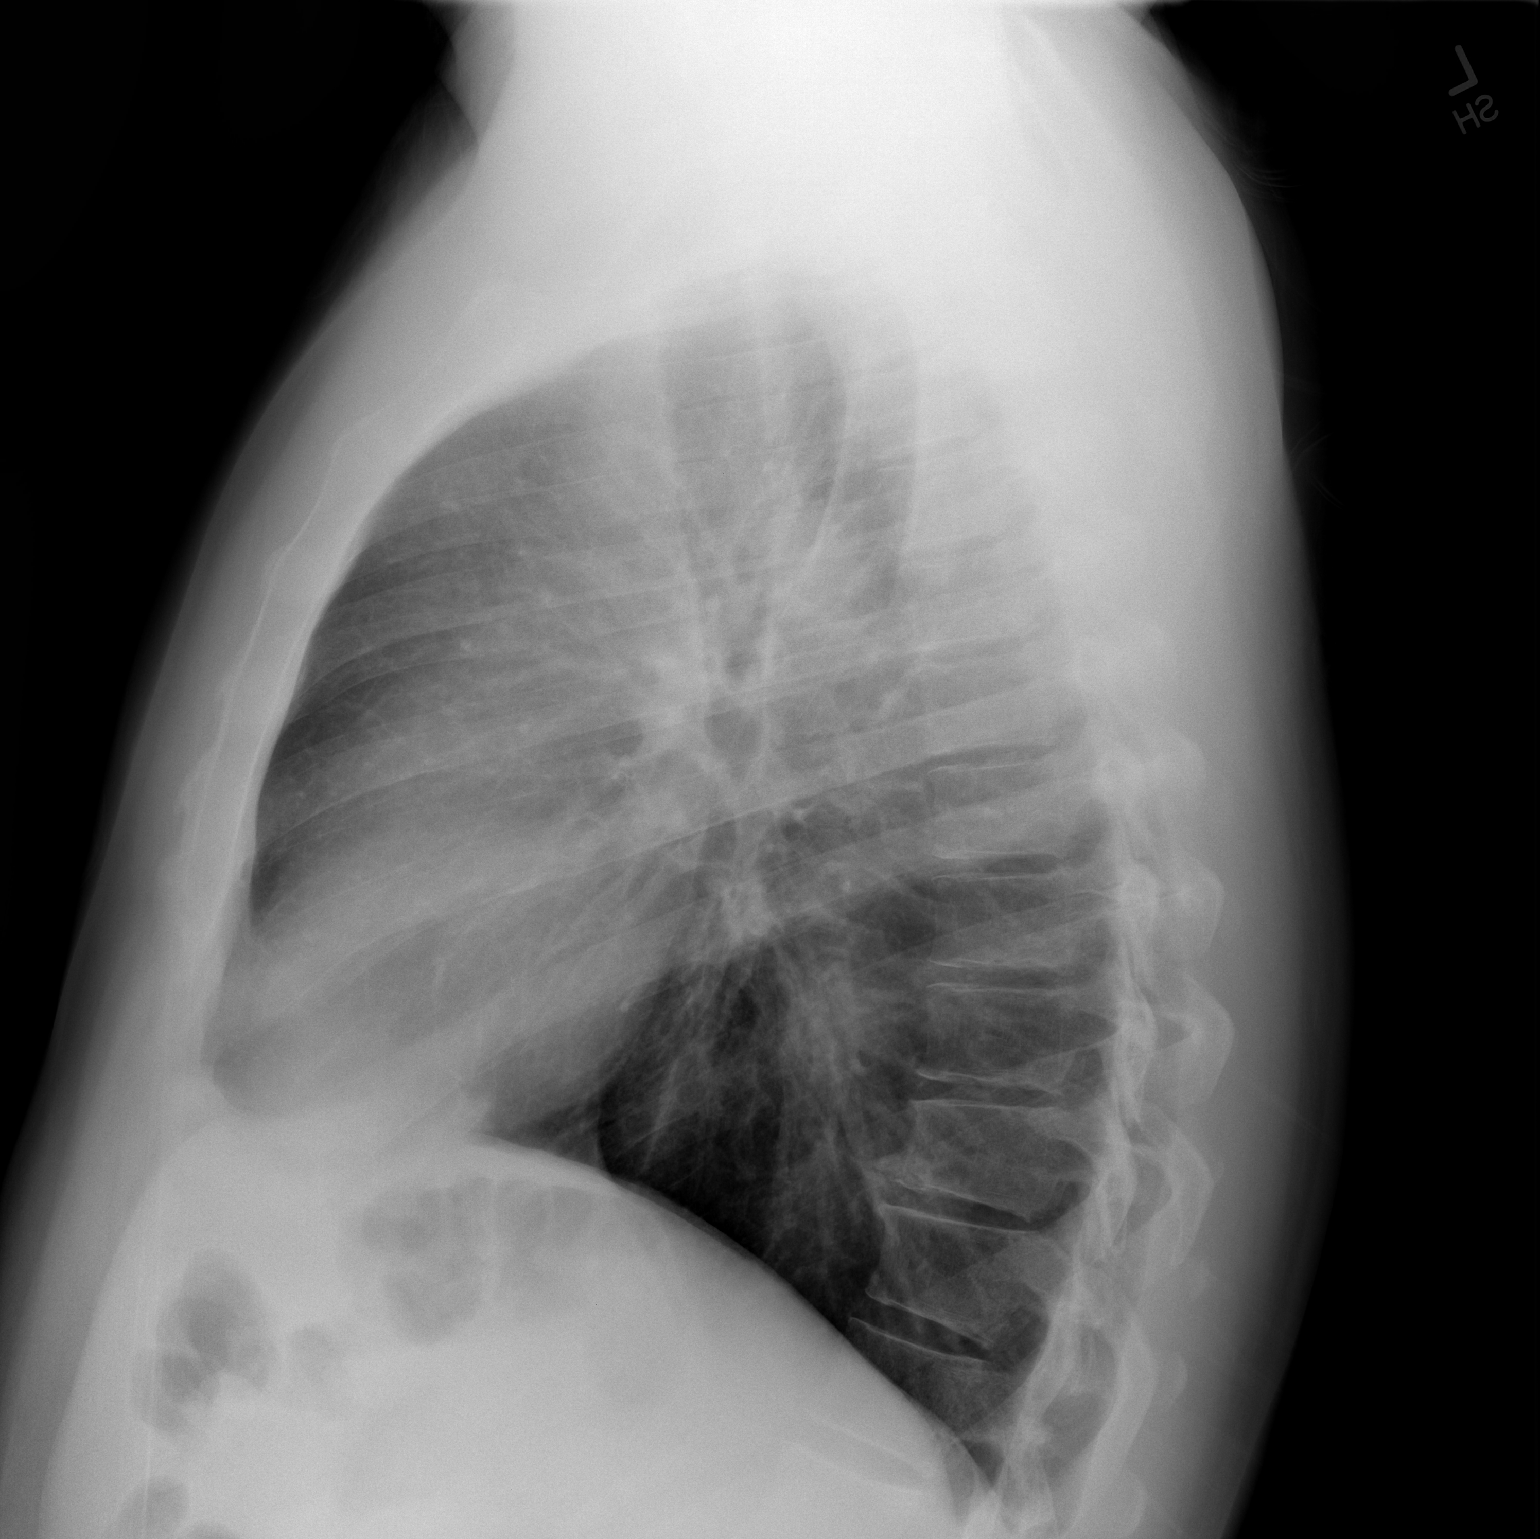

[2 of 2 positions shown; findings below may reference images not displayed]

FINDINGS: The heart size and mediastinal contours are within normal limits.
Both lungs are clear. No pneumothorax or pleural effusion is noted.
The visualized skeletal structures are unremarkable.
IMPRESSION: No active cardiopulmonary disease.
